# Patient Record
Sex: Female | Born: 1965 | Race: Black or African American | Hispanic: No | Marital: Single
Health system: Southern US, Community
[De-identification: ages and names within clinical notes are randomized; demographics above are authoritative.]

## PROBLEM LIST (undated history)

## (undated) HISTORY — PX: NECK SURGERY: SHX720

---

## 2018-08-20 ENCOUNTER — Emergency Department (HOSPITAL_COMMUNITY): Payer: No Typology Code available for payment source

## 2018-08-20 ENCOUNTER — Emergency Department (HOSPITAL_COMMUNITY)
Admission: EM | Admit: 2018-08-20 | Discharge: 2018-08-20 | Disposition: A | Discharge status: Home / Self Care | Payer: No Typology Code available for payment source | Attending: Emergency Medicine | Admitting: Emergency Medicine

## 2018-08-20 ENCOUNTER — Encounter (HOSPITAL_COMMUNITY): Payer: Self-pay | Admitting: Emergency Medicine

## 2018-08-20 ENCOUNTER — Other Ambulatory Visit: Payer: Self-pay

## 2018-08-20 DIAGNOSIS — Y999 Unspecified external cause status: Secondary | ICD-10-CM | POA: Diagnosis not present

## 2018-08-20 DIAGNOSIS — M546 Pain in thoracic spine: Secondary | ICD-10-CM | POA: Diagnosis not present

## 2018-08-20 DIAGNOSIS — Y92410 Unspecified street and highway as the place of occurrence of the external cause: Secondary | ICD-10-CM | POA: Diagnosis not present

## 2018-08-20 DIAGNOSIS — M542 Cervicalgia: Secondary | ICD-10-CM | POA: Diagnosis not present

## 2018-08-20 DIAGNOSIS — Y9389 Activity, other specified: Secondary | ICD-10-CM | POA: Diagnosis not present

## 2018-08-20 MED ORDER — LIDOCAINE 5 % EX PTCH: 1.0000 | MEDICATED_PATCH | CUTANEOUS | 0 refills | Status: AC

## 2018-08-20 MED ORDER — METHOCARBAMOL 500 MG PO TABS: 500.0000 mg | ORAL_TABLET | Freq: Three times a day (TID) | ORAL | 0 refills | Status: AC | PRN
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
## 2021-05-17 IMAGING — CT CT CERVICAL SPINE WITHOUT CONTRAST
3 of 4 series · 12 of 33 positions shown, 14 images · non-contrast
Comparison: None.

CLINICAL DATA: Increased back pain radiating to the midthoracic
spine

EXAM:
CT CERVICAL SPINE WITHOUT CONTRAST
TECHNIQUE: Multidetector CT imaging of the cervical spine was performed without
intravenous contrast. Multiplanar CT image reconstructions were also
generated.

[Series 7: orthogonal bone · axial · 0.23mm/px · z∈[+1173,+1301]mm · 4 of 107 slices shown, 5 images]
[im 18/107  soft-tissue]
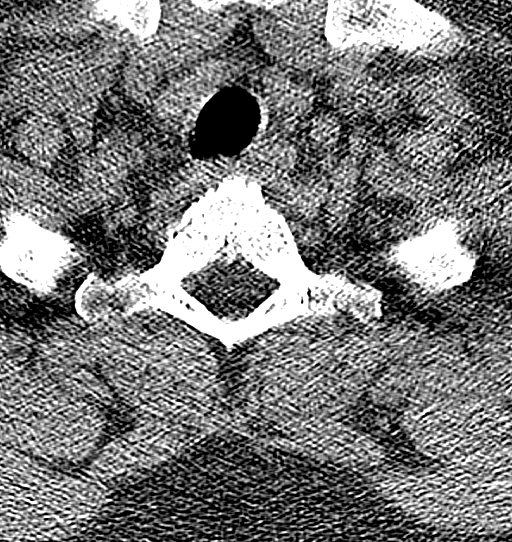
[im 18/107  bone]
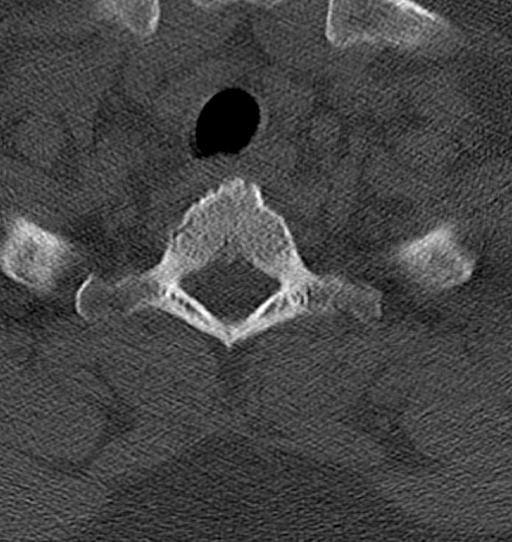
[im 36/107  bone]
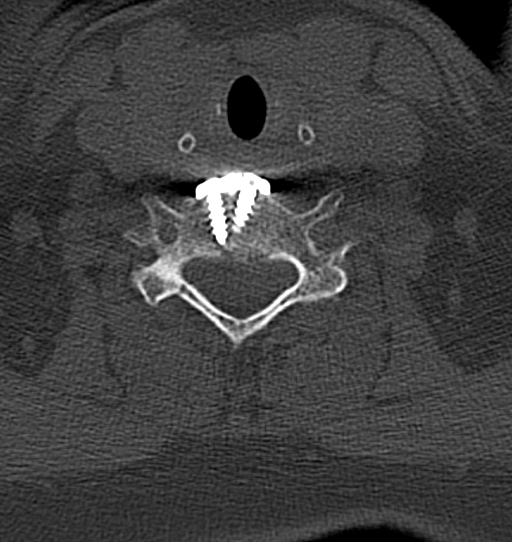
[im 71/107  bone]
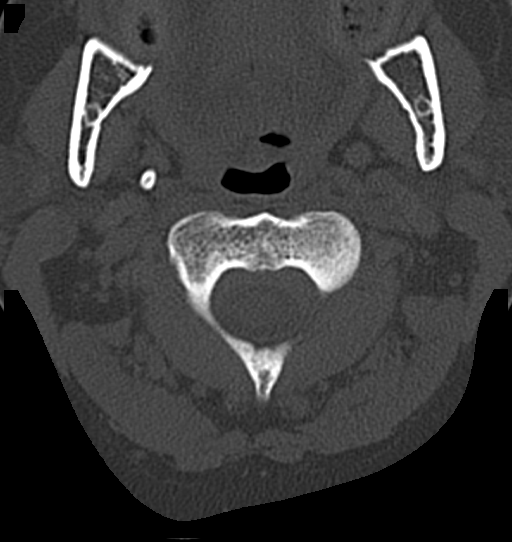
[im 89/107  bone]
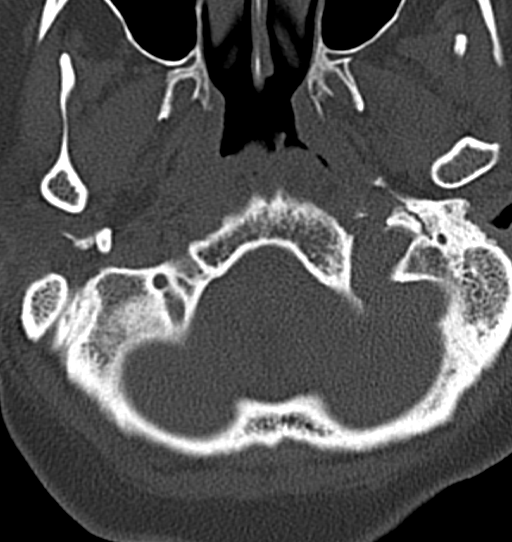

[Series 8: coronal bone · coronal · 0.21mm/px · 3 of 61 slices shown]
[im 13/61  bone]
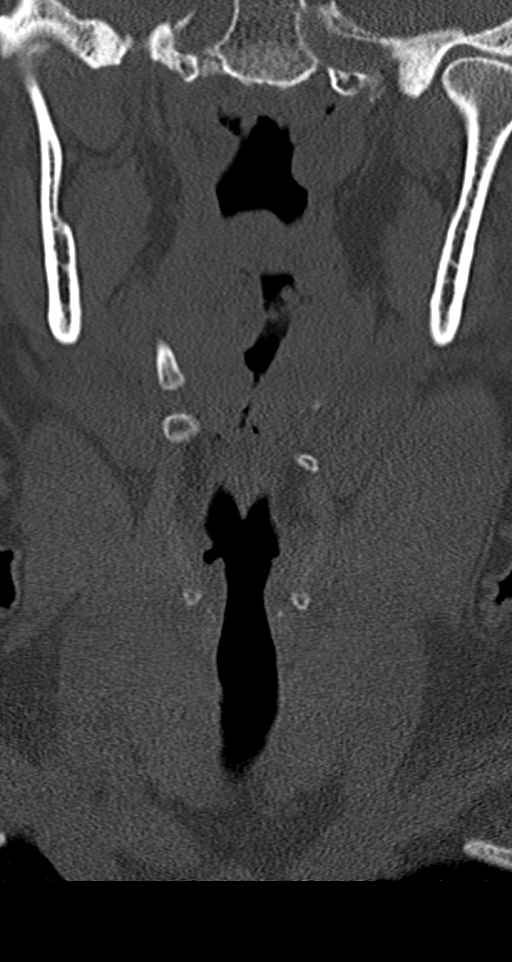
[im 25/61  bone]
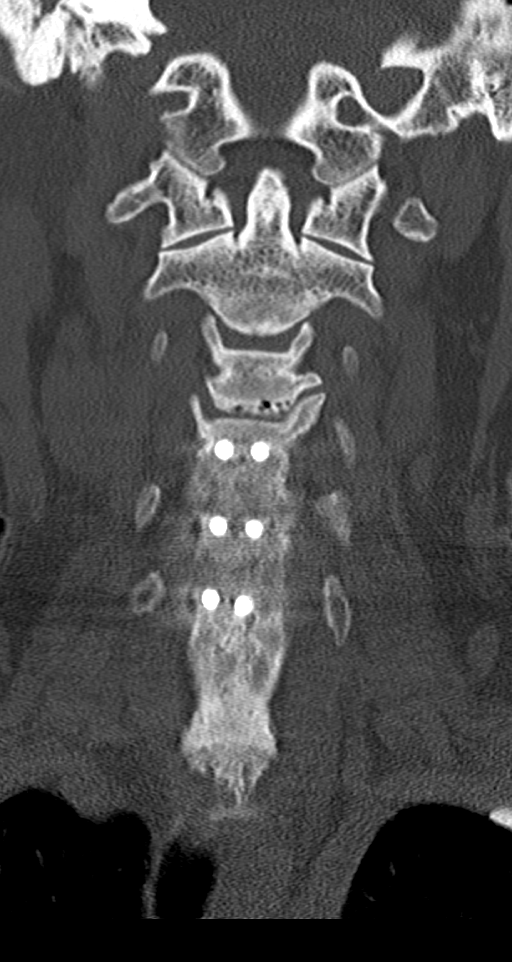
[im 37/61  bone]
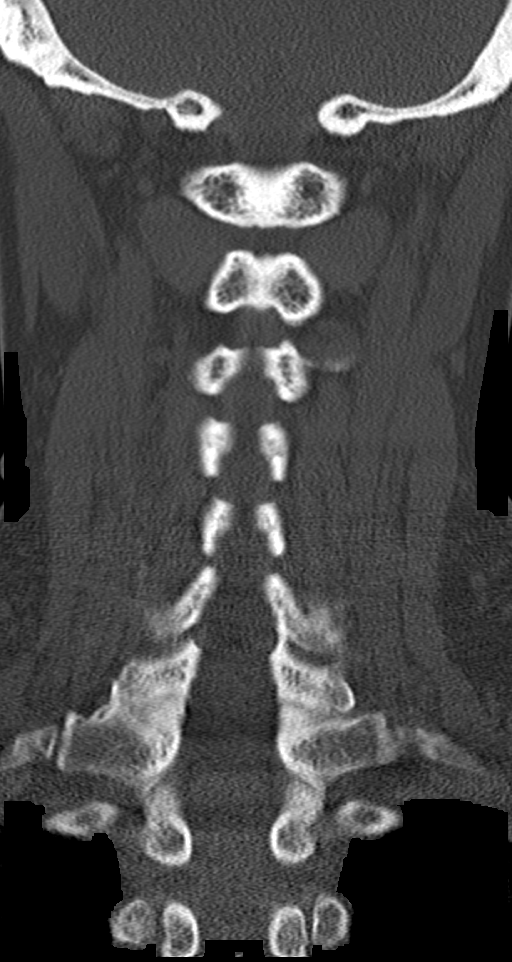

[Series 9: sagittal bone · sagittal · 0.27mm/px · 5 of 61 slices shown, 6 images]
[im 21/61  bone]
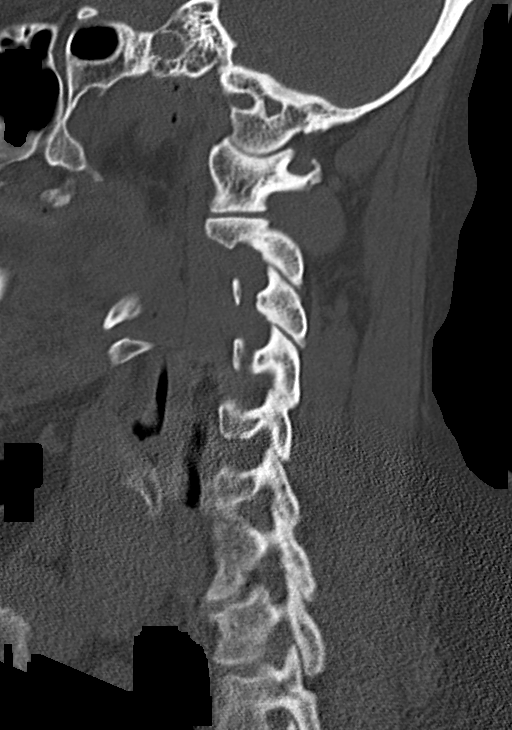
[im 26/61  bone]
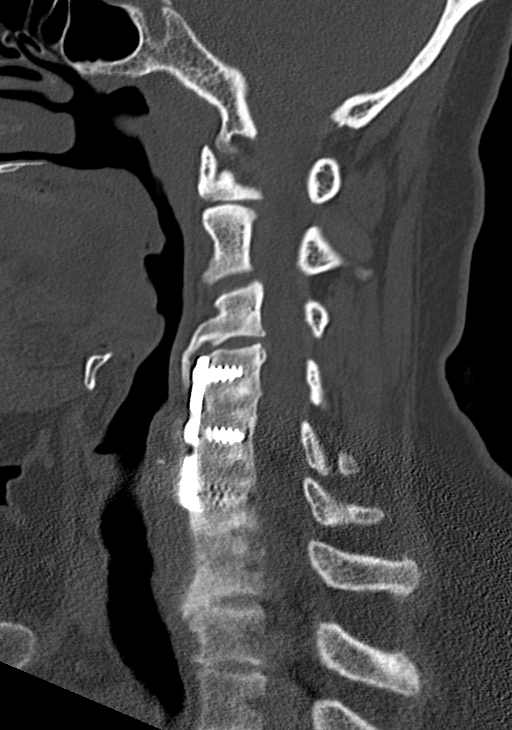
[im 31/61  soft-tissue]
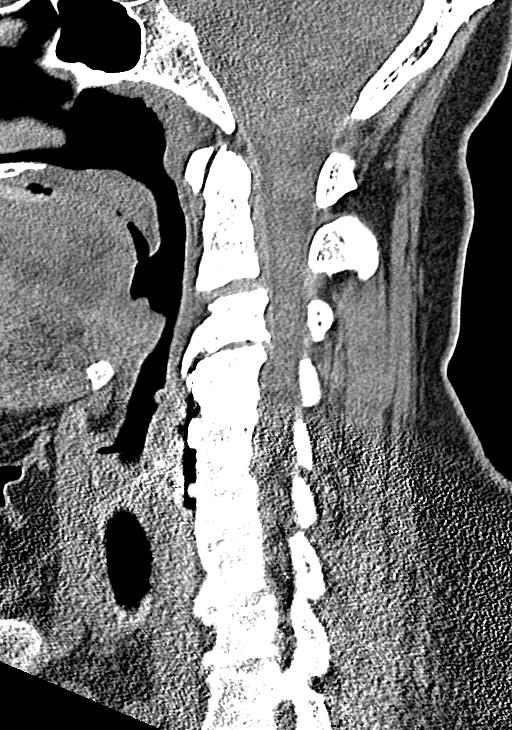
[im 31/61  bone]
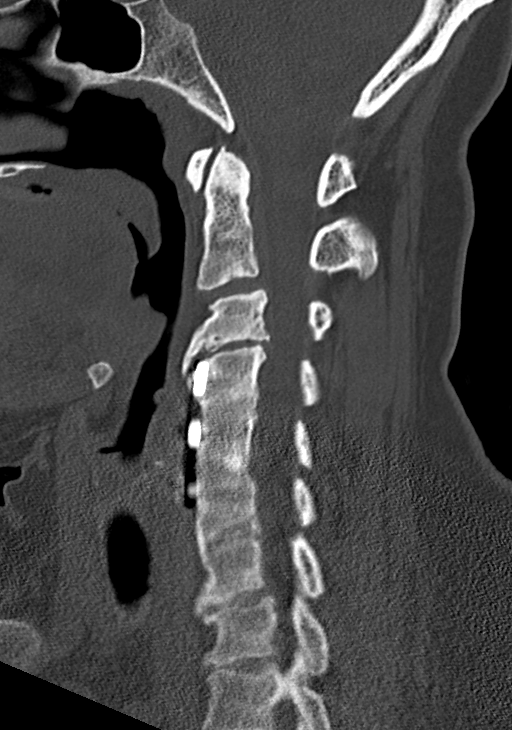
[im 36/61  bone]
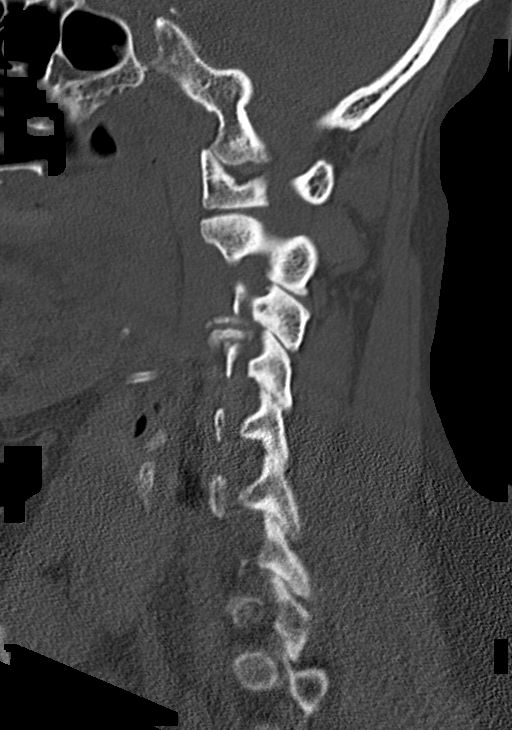
[im 41/61  bone]
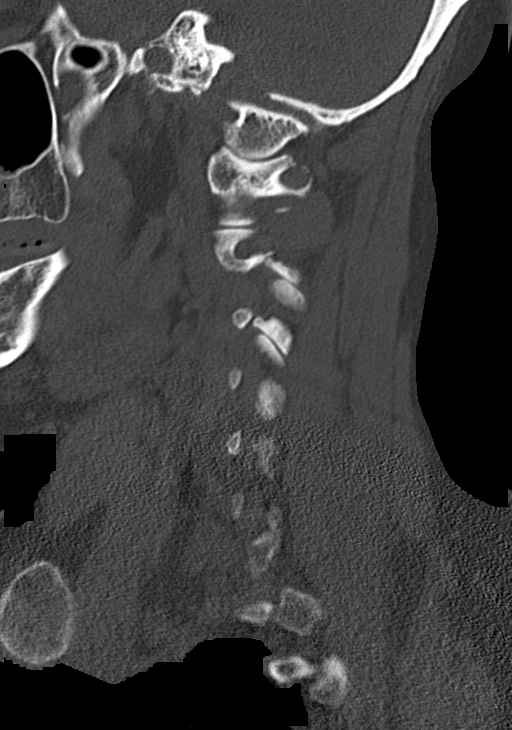

[12 of 33 positions shown; findings below may reference images not displayed]

FINDINGS: Alignment: Normal.

Skull base and vertebrae: No acute fracture. No primary bone lesion
or focal pathologic process.

Soft tissues and spinal canal: No prevertebral fluid or swelling. No
visible canal hematoma.

Disc levels: Anterior cervical fusion from C4 through C7 with plate
and screw fixation at C4-C6 and solid osseous bridging across the
disc spaces. Degenerative disease with disc height loss at C3-4 and
C7-T1. Bilateral uncovertebral degenerative changes with left
foraminal stenosis at C3-4. Mild bilateral foraminal narrowing at
C7-T1.

Upper chest: Lung apices are clear.

Other: No fluid collection or hematoma.
IMPRESSION: 1.  No acute osseous injury of the cervical spine.
2. Anterior cervical fusion from C4 through C7.
